# Patient Record
Sex: Female | Born: 1990 | Race: White | Hispanic: No | Marital: Single | Smoking: Never smoker
Health system: Southern US, Community
[De-identification: ages and names within clinical notes are randomized; demographics above are authoritative.]

---
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
## 2010-12-19 ENCOUNTER — Ambulatory Visit (HOSPITAL_COMMUNITY)
Admission: RE | Admit: 2010-12-19 | Discharge: 2010-12-20 | Payer: Self-pay | Source: Home / Self Care | Attending: Orthopaedic Surgery | Admitting: Orthopaedic Surgery

## 2011-02-25 LAB — BASIC METABOLIC PANEL
BUN: 14 mg/dL (ref 6–23)
CO2: 26 mEq/L (ref 19–32)
Calcium: 9.7 mg/dL (ref 8.4–10.5)
Chloride: 106 mEq/L (ref 96–112)
Creatinine, Ser: 0.72 mg/dL (ref 0.4–1.2)
GFR calc Af Amer: >60 mL/min (ref >60)
GFR calc non Af Amer: >60 mL/min (ref >60)
Glucose, Bld: 61 mg/dL — ABNORMAL LOW (ref 70–99)
Potassium: 4.4 mEq/L (ref 3.5–5.1)
Sodium: 139 mEq/L (ref 135–145)

## 2011-02-25 LAB — CBC
HCT: 41 % (ref 36.0–46.0)
Hemoglobin: 13.6 g/dL (ref 12.0–15.0)
MCH: 30.5 pg (ref 26.0–34.0)
MCHC: 33.2 g/dL (ref 30.0–36.0)
MCV: 91.9 fL (ref 78.0–100.0)
Platelets: 253 10*3/uL (ref 150–400)
RBC: 4.46 MIL/uL (ref 3.87–5.11)
RDW: 12.3 % (ref 11.5–15.5)
WBC: 8.5 10*3/uL (ref 4.0–10.5)

## 2011-02-25 LAB — DIFFERENTIAL
Basophils Absolute: 0 10*3/uL (ref 0.0–0.1)
Basophils Relative: 1 % (ref 0–1)
Eosinophils Absolute: 0.3 10*3/uL (ref 0.0–0.7)
Eosinophils Relative: 3 % (ref 0–5)
Lymphocytes Relative: 24 % (ref 12–46)
Lymphs Abs: 2.1 10*3/uL (ref 0.7–4.0)
Monocytes Absolute: 0.8 10*3/uL (ref 0.1–1.0)
Monocytes Relative: 10 % (ref 3–12)
Neutro Abs: 5.3 10*3/uL (ref 1.7–7.7)
Neutrophils Relative %: 62 % (ref 43–77)

## 2011-02-25 LAB — PROTIME-INR
INR: 1.08 (ref 0.00–1.49)
Prothrombin Time: 14.2 seconds (ref 11.6–15.2)

## 2011-02-25 LAB — PREGNANCY, URINE: Preg Test, Ur: NEGATIVE

## 2011-02-25 LAB — SURGICAL PCR SCREEN
MRSA, PCR: NEGATIVE
Staphylococcus aureus: NEGATIVE

## 2012-08-03 ENCOUNTER — Other Ambulatory Visit: Payer: Self-pay | Admitting: Occupational Medicine

## 2012-08-03 ENCOUNTER — Ambulatory Visit: Payer: Self-pay

## 2012-08-03 DIAGNOSIS — Z021 Encounter for pre-employment examination: Secondary | ICD-10-CM

## 2012-12-16 IMAGING — CR DG CHEST 1V
1 series · 1 of 1 positions shown · non-contrast
Comparison: None.

CLINICAL DATA: Pre employment physical exam, smoker

CHEST - 1 VIEW

[view not recorded]
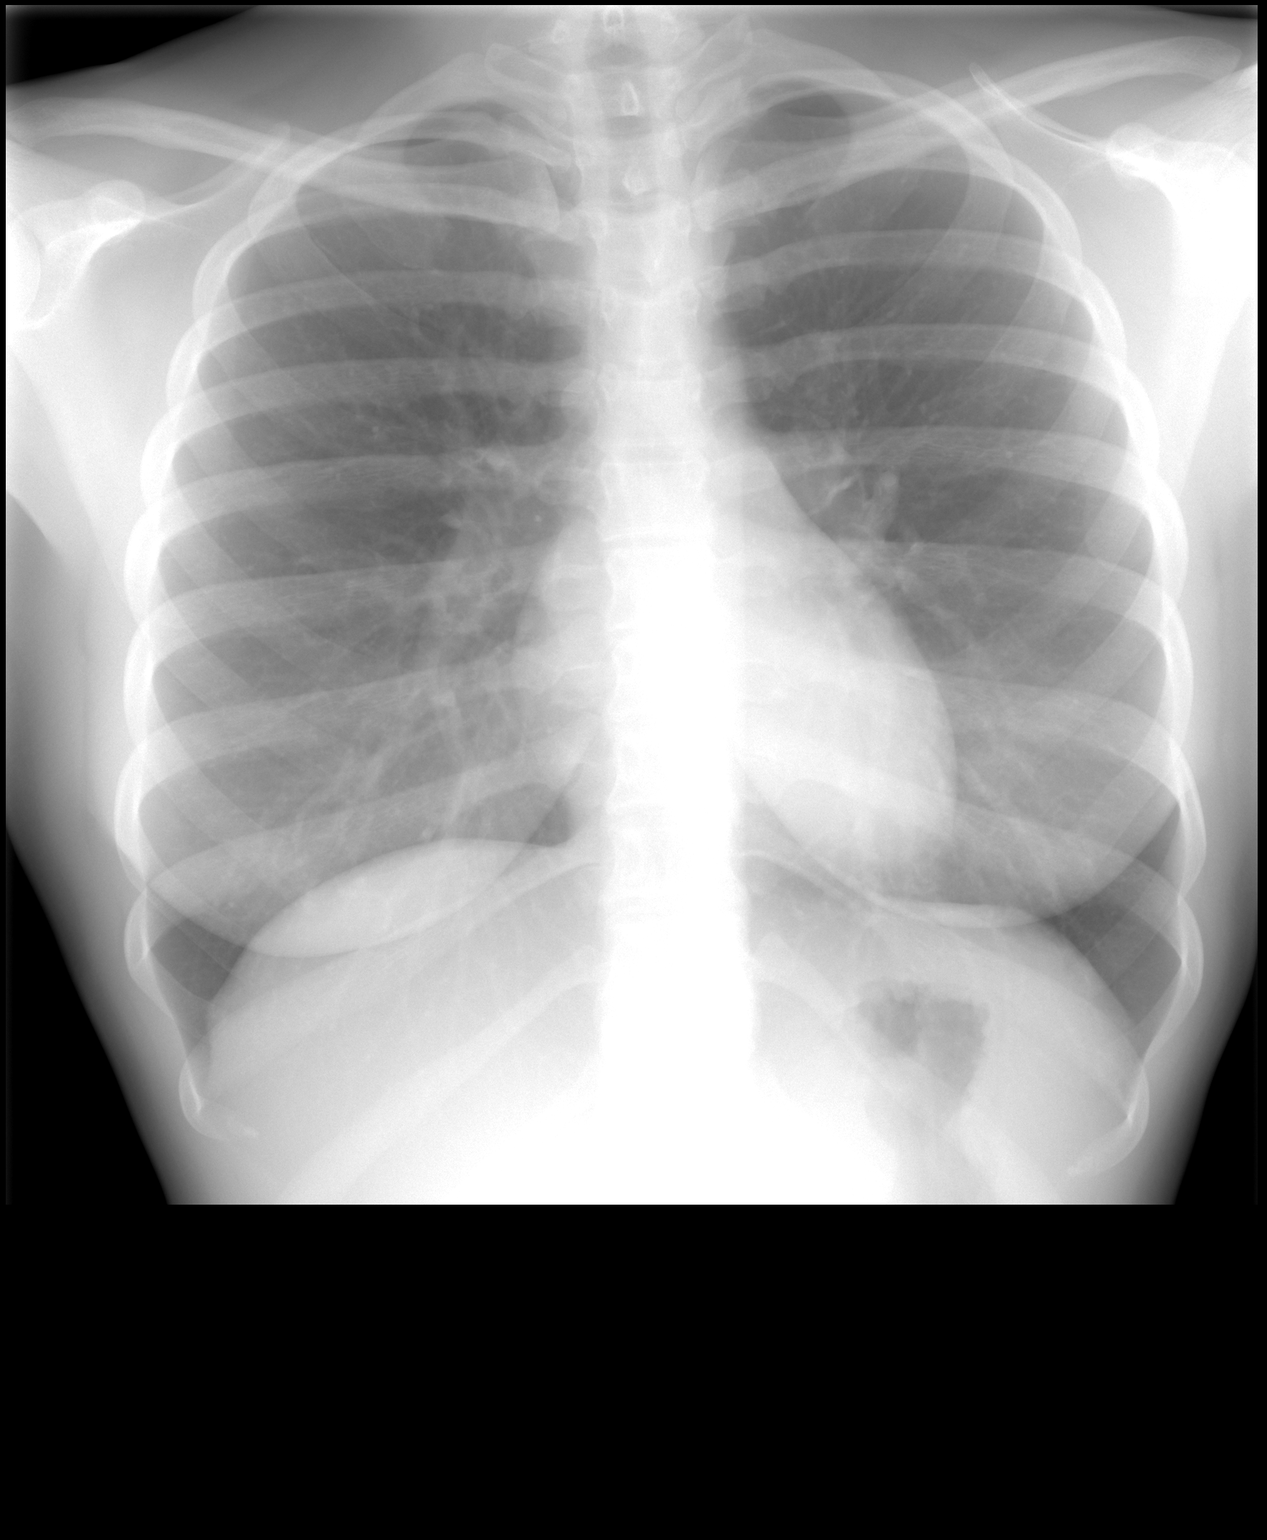

[1 of 1 positions shown; findings below may reference images not displayed]

FINDINGS: Cardiomediastinal silhouette is unremarkable.  No acute
infiltrate or pleural effusion.  No pulmonary edema.  Bony thorax
is unremarkable.
IMPRESSION: No active disease.

## 2017-10-10 ENCOUNTER — Ambulatory Visit (HOSPITAL_COMMUNITY)
Admission: EM | Admit: 2017-10-10 | Discharge: 2017-10-10 | Disposition: A | Discharge status: Home / Self Care | Payer: BLUE CROSS/BLUE SHIELD | Attending: Emergency Medicine | Admitting: Emergency Medicine

## 2017-10-10 ENCOUNTER — Encounter (HOSPITAL_COMMUNITY): Payer: Self-pay | Admitting: Emergency Medicine

## 2017-10-10 DIAGNOSIS — S39012A Strain of muscle, fascia and tendon of lower back, initial encounter: Secondary | ICD-10-CM
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
## 2017-10-10 NOTE — Discharge Instructions (Signed)
Heat, stretches, limit lifting and pulling. Ibuprofen and Tylenol for pain.
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
